# Patient Record
Sex: Female | Born: 1990
Health system: Southern US, Community
[De-identification: ages and names within clinical notes are randomized; demographics above are authoritative.]

---
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
## 2012-03-20 ENCOUNTER — Emergency Department (HOSPITAL_COMMUNITY)
Admission: EM | Admit: 2012-03-20 | Discharge: 2012-03-20 | Discharge status: Against Medical Advice | Payer: Self-pay | Attending: Emergency Medicine | Admitting: Emergency Medicine

## 2012-03-20 ENCOUNTER — Encounter (HOSPITAL_COMMUNITY): Payer: Self-pay | Admitting: Emergency Medicine

## 2012-03-20 DIAGNOSIS — K089 Disorder of teeth and supporting structures, unspecified: Secondary | ICD-10-CM | POA: Insufficient documentation

## 2012-03-20 DIAGNOSIS — K0889 Other specified disorders of teeth and supporting structures: Secondary | ICD-10-CM

## 2012-03-20 NOTE — ED Notes (Signed)
Pt states that her left cheek began to swell last night.  C/o lt sided dental pain 10/10.

## 2014-08-28 ENCOUNTER — Other Ambulatory Visit (HOSPITAL_COMMUNITY)
Admission: RE | Admit: 2014-08-28 | Discharge: 2014-08-28 | Disposition: A | Discharge status: Home / Self Care | Payer: BLUE CROSS/BLUE SHIELD | Source: Ambulatory Visit | Attending: Family Medicine | Admitting: Family Medicine

## 2014-08-28 ENCOUNTER — Encounter (HOSPITAL_COMMUNITY): Payer: Self-pay | Admitting: Emergency Medicine

## 2014-08-28 ENCOUNTER — Emergency Department (HOSPITAL_COMMUNITY)
Admission: EM | Admit: 2014-08-28 | Discharge: 2014-08-28 | Disposition: A | Discharge status: Home / Self Care | Payer: BLUE CROSS/BLUE SHIELD | Source: Home / Self Care | Attending: Family Medicine | Admitting: Family Medicine

## 2014-08-28 DIAGNOSIS — N760 Acute vaginitis: Secondary | ICD-10-CM

## 2014-08-28 DIAGNOSIS — Z113 Encounter for screening for infections with a predominantly sexual mode of transmission: Secondary | ICD-10-CM | POA: Diagnosis not present

## 2014-08-28 LAB — POCT URINALYSIS DIP (DEVICE)
Bilirubin Urine: NEGATIVE
GLUCOSE, UA: NEGATIVE mg/dL
HGB URINE DIPSTICK: NEGATIVE
Ketones, ur: NEGATIVE mg/dL
Leukocytes, UA: NEGATIVE
NITRITE: NEGATIVE
PROTEIN: NEGATIVE mg/dL
Specific Gravity, Urine: >=1.03 (ref 1.005–1.030)
UROBILINOGEN UA: 0.2 mg/dL (ref 0.0–1.0)
pH: 6 (ref 5.0–8.0)

## 2014-08-28 LAB — POCT PREGNANCY, URINE: Preg Test, Ur: NEGATIVE

## 2014-08-28 MED ORDER — LIDOCAINE HCL (PF) 1 % IJ SOLN
INTRAMUSCULAR | Status: AC
  Filled 2014-08-28: qty 5

## 2014-08-28 MED ORDER — AZITHROMYCIN 250 MG PO TABS
ORAL_TABLET | ORAL | Status: AC
  Filled 2014-08-28: qty 4

## 2014-08-28 MED ORDER — CEFTRIAXONE SODIUM 250 MG IJ SOLR
250.0000 mg | Freq: Once | INTRAMUSCULAR | Status: AC
  Administered 2014-08-28: 250 mg via INTRAMUSCULAR

## 2014-08-28 MED ORDER — CEFTRIAXONE SODIUM 250 MG IJ SOLR
INTRAMUSCULAR | Status: AC
  Filled 2014-08-28: qty 250

## 2014-08-28 MED ORDER — AZITHROMYCIN 250 MG PO TABS
1000.0000 mg | ORAL_TABLET | Freq: Once | ORAL | Status: AC
  Administered 2014-08-28: 1000 mg via ORAL
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
## 2014-08-28 NOTE — Discharge Instructions (Signed)
You have been [facility] and [facility] Will notify if results indicate need for additional testing Declined serology testing for HIV and syphilis. Follow up with your doctor if symptoms persist Vaginitis Vaginitis is an inflammation of the vagina. It is most often caused by a change in the normal balance of the bacteria and yeast that live in the vagina. This change in balance causes an overgrowth of certain bacteria or yeast, which causes the inflammation. There are different types of vaginitis, but the most common types are:  Bacterial vaginosis.  Yeast infection (candidiasis).  Trichomoniasis vaginitis. This is a sexually transmitted infection (STI).  Viral vaginitis.  Atropic vaginitis.  Allergic vaginitis. CAUSES  The cause depends on the type of vaginitis. Vaginitis can be caused by:  Bacteria (bacterial vaginosis).  Yeast (yeast infection).  A parasite (trichomoniasis vaginitis)  A virus (viral vaginitis).  Low hormone levels (atrophic vaginitis). Low hormone levels can occur during pregnancy, breastfeeding, or after menopause.  Irritants, such as bubble baths, scented tampons, and feminine sprays (allergic vaginitis). Other factors can change the normal balance of the yeast and bacteria that live in the vagina. These include:  Antibiotic medicines.  Poor hygiene.  Diaphragms, vaginal sponges, spermicides, birth control pills, and intrauterine devices (IUD).  Sexual intercourse.  Infection.  Uncontrolled diabetes.  A weakened immune system. SYMPTOMS  Symptoms can vary depending on the cause of the vaginitis. Common symptoms include:  Abnormal vaginal discharge.  The discharge is white, gray, or yellow with bacterial vaginosis.  The discharge is thick, white, and cheesy with a yeast infection.  The discharge is frothy and yellow or greenish with trichomoniasis.  A bad vaginal odor.  The odor is fishy with bacterial  vaginosis.  Vaginal itching, pain, or swelling.  Painful intercourse.  Pain or burning when urinating. Sometimes, there are no symptoms. TREATMENT  Treatment will vary depending on the type of infection.   Bacterial vaginosis and trichomoniasis are often treated with antibiotic creams or pills.  Yeast infections are often treated with antifungal medicines, such as vaginal creams or suppositories.  Viral vaginitis has no cure, but symptoms can be treated with medicines that relieve discomfort. Your sexual partner should be treated as well.  Atrophic vaginitis may be treated with an estrogen cream, pill, suppository, or vaginal ring. If vaginal dryness occurs, lubricants and moisturizing creams may help. You may be told to avoid scented soaps, sprays, or douches.  Allergic vaginitis treatment involves quitting the use of the product that is causing the problem. Vaginal creams can be used to treat the symptoms. HOME CARE INSTRUCTIONS   Take all medicines as directed by your caregiver.  Keep your genital area clean and dry. Avoid soap and only rinse the area with water.  Avoid douching. It can remove the healthy bacteria in the vagina.  Do not use tampons or have sexual intercourse until your vaginitis has been treated. Use sanitary pads while you have vaginitis.  Wipe from front to back. This avoids the spread of bacteria from the rectum to the vagina.  Let air reach your genital area.  Wear cotton underwear to decrease moisture buildup.  Avoid wearing underwear while you sleep until your vaginitis is gone.  Avoid tight pants and underwear or nylons without a cotton panel.  Take off wet clothing (especially bathing suits) as soon as possible.  Use mild, non-scented products. Avoid using irritants, such as:  Scented feminine sprays.  Fabric softeners.  Scented detergents.  Scented  tampons.  Scented soaps or bubble baths.  Practice safe sex and use condoms. Condoms  may prevent the spread of trichomoniasis and viral vaginitis. SEEK MEDICAL CARE IF:   You have abdominal pain.  You have a fever or persistent symptoms for more than 2-3 days.  You have a fever and your symptoms suddenly get worse. Document Released: 03/01/2007 Document Revised: 01/27/2012 Document Reviewed: 10/15/2011 [facility] Patient Information 2015 [place], [place]. This information is not intended to replace advice given to you by your health care provider. Make sure you discuss any questions you have with your health care provider.

## 2014-08-28 NOTE — ED Provider Notes (Signed)
CSN: [id number]     Arrival date & time 08/28/14  1002 History   First MD Initiated Contact with Patient 08/28/14 1124     Chief Complaint  Patient presents with  . Vaginal Pain   (Consider location/radiation/quality/duration/timing/severity/associated sxs/prior Treatment) HPI Comments: Patient expresses concern regarding possible STI. [facility]  Patient is a 24 y.o. female presenting with vaginal discharge.  Vaginal Discharge Quality:  Milky and white Severity:  Mild Onset quality:  Gradual Duration:  1 week Timing:  Constant Progression:  Unchanged Chronicity:  New Context comment:  Sx began after oral sex one week ago   History reviewed. No pertinent past medical history. History reviewed. No pertinent past surgical history. No family history on file. History  Substance Use Topics  . Smoking status: Current Every Day Smoker  . Smokeless tobacco: Not on file  . Alcohol Use: No   OB History    No data available     Review of Systems  Genitourinary: Positive for vaginal discharge.  All other systems reviewed and are negative.   Allergies  Review of patient's allergies indicates no known allergies.  Home Medications   Prior to Admission medications   Medication Sig Start Date End Date Taking? Authorizing Provider  ibuprofen (ADVIL,MOTRIN) 200 MG tablet Take 200 mg by mouth every 6 (six) hours as needed. For pain    Historical Provider, MD   BP 101/65 mmHg  Pulse 81  Temp(Src) 97.9 F (36.6 C) (Oral)  Resp 12  SpO2 97%  LMP 08/15/2014 Physical Exam  Constitutional: She is oriented to person, place, and time. She appears well-developed and well-nourished.  HENT:  Head: Normocephalic and atraumatic.  Mouth/Throat: Oropharynx is clear and moist. No oropharyngeal exudate.  Cardiovascular: Normal rate.   Pulmonary/Chest: Effort normal.  Abdominal: Soft. Normal appearance and bowel sounds are normal. She exhibits no distension and  no mass. There is no tenderness. There is no rigidity, no rebound and no guarding.  Genitourinary: Vagina normal and uterus normal. Pelvic exam was performed with patient supine. There is no rash, tenderness or lesion on the right labia. There is no rash, tenderness or lesion on the left labia. Cervix exhibits no motion tenderness, no discharge and no friability. Right adnexum displays no mass, no tenderness and no fullness. Left adnexum displays no mass, no tenderness and no fullness.  Musculoskeletal: Normal range of motion.  Neurological: She is alert and oriented to person, place, and time.  Skin: Skin is warm and dry.  Psychiatric: She has a normal mood and affect. Her behavior is normal.  Nursing note and vitals reviewed.   ED Course  Procedures (including critical care time) Labs Review Labs Reviewed  POCT URINALYSIS DIP (DEVICE)  POCT PREGNANCY, URINE  CERVICOVAGINAL ANCILLARY ONLY    Imaging Review No results found.   MDM   1. Vaginitis    Cervicovaginal test results pend. Treated empirically for gonorrhea and chlamydia with azithromycin 1000mg  po and ceftriaxone 250mg  [facility] Will notify if results indicate need for additional testing Declined serology for HIV and syphilis. Follow up PCP if symptoms persist     Ria ClockJennifer Lee H Presson, [place] 08/28/14 1138

## 2014-08-28 NOTE — ED Notes (Signed)
Vaginal pain, dryness, irritation since last Monday 4/4.  Denies back pain, denies abdominal pain.  Denies urinary symptoms. Patient concerned for std

## 2014-08-29 LAB — CERVICOVAGINAL ANCILLARY ONLY
CHLAMYDIA, DNA PROBE: NEGATIVE
Neisseria Gonorrhea: NEGATIVE
WET PREP (BD AFFIRM): NEGATIVE

## 2024-01-08 ENCOUNTER — Other Ambulatory Visit: Payer: Self-pay

## 2024-01-08 ENCOUNTER — Emergency Department (HOSPITAL_COMMUNITY)
Admission: EM | Admit: 2024-01-08 | Discharge: 2024-01-08 | Disposition: A | Discharge status: Home / Self Care | Attending: Emergency Medicine | Admitting: Emergency Medicine

## 2024-01-08 ENCOUNTER — Encounter (HOSPITAL_COMMUNITY): Payer: Self-pay

## 2024-01-08 ENCOUNTER — Emergency Department (HOSPITAL_COMMUNITY)

## 2024-01-08 DIAGNOSIS — S80212A Abrasion, left knee, initial encounter: Secondary | ICD-10-CM | POA: Diagnosis not present

## 2024-01-08 DIAGNOSIS — R1011 Right upper quadrant pain: Secondary | ICD-10-CM | POA: Diagnosis not present

## 2024-01-08 DIAGNOSIS — Y92410 Unspecified street and highway as the place of occurrence of the external cause: Secondary | ICD-10-CM | POA: Insufficient documentation

## 2024-01-08 DIAGNOSIS — S0181XA Laceration without foreign body of other part of head, initial encounter: Secondary | ICD-10-CM | POA: Diagnosis not present

## 2024-01-08 DIAGNOSIS — S0083XA Contusion of other part of head, initial encounter: Secondary | ICD-10-CM

## 2024-01-08 DIAGNOSIS — R0789 Other chest pain: Secondary | ICD-10-CM | POA: Diagnosis not present

## 2024-01-08 DIAGNOSIS — M542 Cervicalgia: Secondary | ICD-10-CM | POA: Diagnosis not present

## 2024-01-08 DIAGNOSIS — R519 Headache, unspecified: Secondary | ICD-10-CM | POA: Diagnosis present

## 2024-01-08 LAB — I-STAT CHEM 8, ED
BUN: 8 mg/dL (ref 6–20)
Calcium, Ion: 1.14 mmol/L — ABNORMAL LOW (ref 1.15–1.40)
Chloride: 106 mmol/L (ref 98–111)
Creatinine, Ser: 1 mg/dL (ref 0.44–1.00)
Glucose, Bld: 88 mg/dL (ref 70–99)
HCT: 40 % (ref 36.0–46.0)
Hemoglobin: 13.6 g/dL (ref 12.0–15.0)
Potassium: 3.8 mmol/L (ref 3.5–5.1)
Sodium: 141 mmol/L (ref 135–145)
TCO2: 19 mmol/L — ABNORMAL LOW (ref 22–32)

## 2024-01-08 LAB — COMPREHENSIVE METABOLIC PANEL WITH GFR
ALT: 18 U/L (ref 0–44)
AST: 30 U/L (ref 15–41)
Albumin: 3.5 g/dL (ref 3.5–5.0)
Alkaline Phosphatase: 46 U/L (ref 38–126)
Anion gap: 10 (ref 5–15)
BUN: 7 mg/dL (ref 6–20)
CO2: 19 mmol/L — ABNORMAL LOW (ref 22–32)
Calcium: 8.7 mg/dL — ABNORMAL LOW (ref 8.9–10.3)
Chloride: 108 mmol/L (ref 98–111)
Creatinine, Ser: 0.84 mg/dL (ref 0.44–1.00)
GFR, Estimated: >60 mL/min (ref >60)
Glucose, Bld: 91 mg/dL (ref 70–99)
Potassium: 3.7 mmol/L (ref 3.5–5.1)
Sodium: 137 mmol/L (ref 135–145)
Total Bilirubin: 0.5 mg/dL (ref 0.0–1.2)
Total Protein: 6.9 g/dL (ref 6.5–8.1)

## 2024-01-08 LAB — CBC WITH DIFFERENTIAL/PLATELET
Abs Immature Granulocytes: 0.03 K/uL (ref 0.00–0.07)
Basophils Absolute: 0.1 K/uL (ref 0.0–0.1)
Basophils Relative: 1 %
Eosinophils Absolute: 0.3 K/uL (ref 0.0–0.5)
Eosinophils Relative: 5 %
HCT: 36.3 % (ref 36.0–46.0)
Hemoglobin: 11.8 g/dL — ABNORMAL LOW (ref 12.0–15.0)
Immature Granulocytes: 0 %
Lymphocytes Relative: 24 %
Lymphs Abs: 1.8 K/uL (ref 0.7–4.0)
MCH: 30.4 pg (ref 26.0–34.0)
MCHC: 32.5 g/dL (ref 30.0–36.0)
MCV: 93.6 fL (ref 80.0–100.0)
Monocytes Absolute: 0.5 K/uL (ref 0.1–1.0)
Monocytes Relative: 7 %
Neutro Abs: 4.7 K/uL (ref 1.7–7.7)
Neutrophils Relative %: 63 %
Platelets: 278 K/uL (ref 150–400)
RBC: 3.88 MIL/uL (ref 3.87–5.11)
RDW: 14.3 % (ref 11.5–15.5)
WBC: 7.5 K/uL (ref 4.0–10.5)
nRBC: 0 % (ref 0.0–0.2)

## 2024-01-08 LAB — HCG, SERUM, QUALITATIVE: Preg, Serum: NEGATIVE

## 2024-01-08 MED ORDER — BACITRACIN ZINC 500 UNIT/GM EX OINT
TOPICAL_OINTMENT | Freq: Two times a day (BID) | CUTANEOUS | Status: DC
  Filled 2024-01-08: qty 1.8

## 2024-01-08 MED ORDER — IOHEXOL 350 MG/ML SOLN
75.0000 mL | Freq: Once | INTRAVENOUS | Status: AC | PRN
  Administered 2024-01-08: 75 mL via INTRAVENOUS

## 2024-01-08 MED ORDER — HYDROCODONE-ACETAMINOPHEN 5-325 MG PO TABS: 1.0000 | ORAL_TABLET | Freq: Four times a day (QID) | ORAL | 0 refills | Status: AC | PRN

## 2024-01-08 MED ORDER — TETANUS-DIPHTH-ACELL PERTUSSIS 5-2.5-18.5 LF-MCG/0.5 IM SUSY
0.5000 mL | PREFILLED_SYRINGE | Freq: Once | INTRAMUSCULAR | Status: AC
  Administered 2024-01-08: 0.5 mL via INTRAMUSCULAR
  Filled 2024-01-08: qty 0.5

## 2024-01-08 MED ORDER — LIDOCAINE-EPINEPHRINE-TETRACAINE (LET) TOPICAL GEL
3.0000 mL | Freq: Once | TOPICAL | Status: AC
  Administered 2024-01-08: 3 mL via TOPICAL
  Filled 2024-01-08: qty 3

## 2024-01-08 MED ORDER — LIDOCAINE-EPINEPHRINE (PF) 2 %-1:200000 IJ SOLN
20.0000 mL | Freq: Once | INTRAMUSCULAR | Status: AC
  Administered 2024-01-08: 20 mL
  Filled 2024-01-08: qty 20

## 2024-01-08 MED ORDER — IBUPROFEN 600 MG PO TABS: 600.0000 mg | ORAL_TABLET | Freq: Four times a day (QID) | ORAL | 0 refills | Status: AC | PRN

## 2024-01-08 MED ORDER — ONDANSETRON HCL 4 MG/2ML IJ SOLN
4.0000 mg | Freq: Once | INTRAMUSCULAR | Status: AC
  Administered 2024-01-08: 4 mg via INTRAVENOUS
  Filled 2024-01-08: qty 2

## 2024-01-08 MED ORDER — CYCLOBENZAPRINE HCL 10 MG PO TABS: 10.0000 mg | ORAL_TABLET | Freq: Two times a day (BID) | ORAL | 0 refills | Status: AC | PRN

## 2024-01-08 MED ORDER — MORPHINE SULFATE (PF) 4 MG/ML IV SOLN
4.0000 mg | Freq: Once | INTRAVENOUS | Status: AC
  Administered 2024-01-08: 4 mg via INTRAVENOUS
  Filled 2024-01-08: qty 1

## 2024-01-08 NOTE — ED Notes (Signed)
Pt ambulated without assistance
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
## 2024-01-08 NOTE — ED Notes (Signed)
 Pt discharged. Pt given discharge papers and papers explained. Pt in NAD at this time

## 2024-01-08 NOTE — ED Notes (Signed)
 Patient transported to CT
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
## 2024-01-13 ENCOUNTER — Other Ambulatory Visit

## 2024-01-13 ENCOUNTER — Other Ambulatory Visit: Payer: Self-pay | Admitting: Family Medicine

## 2024-01-13 DIAGNOSIS — S0083XD Contusion of other part of head, subsequent encounter: Secondary | ICD-10-CM

## 2024-01-14 ENCOUNTER — Ambulatory Visit
Admission: RE | Admit: 2024-01-14 | Discharge: 2024-01-14 | Disposition: A | Discharge status: Home / Self Care | Source: Ambulatory Visit | Attending: Family Medicine | Admitting: Family Medicine

## 2024-01-14 DIAGNOSIS — S0083XD Contusion of other part of head, subsequent encounter: Secondary | ICD-10-CM
